# Patient Record
Sex: Male | Born: 1945 | Race: White | Hispanic: No | Marital: Married | Smoking: Former smoker
Health system: Southern US, Community
[De-identification: ages and names within clinical notes are randomized; demographics above are authoritative.]

## PROBLEM LIST (undated history)

## (undated) DIAGNOSIS — F419 Anxiety disorder, unspecified: Secondary | ICD-10-CM

## (undated) DIAGNOSIS — E119 Type 2 diabetes mellitus without complications: Secondary | ICD-10-CM

## (undated) DIAGNOSIS — C61 Malignant neoplasm of prostate: Secondary | ICD-10-CM

## (undated) HISTORY — PX: EYE SURGERY: SHX253

## (undated) HISTORY — PX: HERNIA REPAIR: SHX51

---

## 2021-01-21 ENCOUNTER — Emergency Department (HOSPITAL_BASED_OUTPATIENT_CLINIC_OR_DEPARTMENT_OTHER)
Admission: EM | Admit: 2021-01-21 | Discharge: 2021-01-21 | Disposition: A | Discharge status: Home / Self Care | Payer: Medicare PPO | Attending: Emergency Medicine | Admitting: Emergency Medicine

## 2021-01-21 ENCOUNTER — Other Ambulatory Visit: Payer: Self-pay

## 2021-01-21 ENCOUNTER — Encounter (HOSPITAL_BASED_OUTPATIENT_CLINIC_OR_DEPARTMENT_OTHER): Payer: Self-pay | Admitting: *Deleted

## 2021-01-21 ENCOUNTER — Emergency Department (HOSPITAL_BASED_OUTPATIENT_CLINIC_OR_DEPARTMENT_OTHER): Payer: Medicare PPO

## 2021-01-21 DIAGNOSIS — Z87891 Personal history of nicotine dependence: Secondary | ICD-10-CM | POA: Diagnosis not present

## 2021-01-21 DIAGNOSIS — M5441 Lumbago with sciatica, right side: Secondary | ICD-10-CM

## 2021-01-21 DIAGNOSIS — M4310 Spondylolisthesis, site unspecified: Secondary | ICD-10-CM | POA: Insufficient documentation

## 2021-01-21 DIAGNOSIS — M8938 Hypertrophy of bone, other site: Secondary | ICD-10-CM | POA: Diagnosis not present

## 2021-01-21 DIAGNOSIS — Z8546 Personal history of malignant neoplasm of prostate: Secondary | ICD-10-CM | POA: Insufficient documentation

## 2021-01-21 DIAGNOSIS — E119 Type 2 diabetes mellitus without complications: Secondary | ICD-10-CM | POA: Diagnosis not present

## 2021-01-21 DIAGNOSIS — M549 Dorsalgia, unspecified: Secondary | ICD-10-CM | POA: Diagnosis present

## 2021-01-21 DIAGNOSIS — Z794 Long term (current) use of insulin: Secondary | ICD-10-CM | POA: Insufficient documentation

## 2021-01-21 HISTORY — DX: Anxiety disorder, unspecified: F41.9

## 2021-01-21 HISTORY — DX: Malignant neoplasm of prostate: C61

## 2021-01-21 HISTORY — DX: Type 2 diabetes mellitus without complications: E11.9

## 2021-01-21 MED ORDER — LORAZEPAM 1 MG PO TABS
1.0000 mg | ORAL_TABLET | Freq: Once | ORAL | Status: AC
  Administered 2021-01-21: 1 mg via ORAL
  Filled 2021-01-21: qty 1

## 2021-01-21 MED ORDER — TRAMADOL HCL 50 MG PO TABS
50.0000 mg | ORAL_TABLET | Freq: Once | ORAL | Status: AC
  Administered 2021-01-21: 50 mg via ORAL
  Filled 2021-01-21: qty 1

## 2021-01-21 MED ORDER — LIDOCAINE 5 % EX PTCH: 1.0000 | MEDICATED_PATCH | CUTANEOUS | 0 refills | Status: AC

## 2021-01-21 NOTE — ED Triage Notes (Signed)
C/o lower right back pain which radiates down into right hip x 3 days

## 2021-01-21 NOTE — Discharge Instructions (Signed)
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  You likely have a pinched nerve in the back causing your pain  You can try lidocaine patch for pain relief.  Take Tylenol Motrin for pain as well.  Follow-up with a spine doctor  Return to ER if you have worse back pain, hip pain, numbness or weakness

## 2021-01-21 NOTE — ED Provider Notes (Addendum)
Stephen Benitez   CSN: [id number] Arrival date & time: 01/21/21  2124     History Chief Complaint  Patient presents with  . Back Pain    Stephen Benitez is a 75 y.o. male history anxiety, diabetes, prostate cancer here presenting with back pain.  Patient has been having right-sided back pain for the last 3 days.  Patient states that it is worse with movement.  It radiates to the right hip but does not go down his leg.  Denies any trauma or injury.  Denies any trouble walking.  He states that he is having panic attack right now as well but denies any chest pain or shortness of breath  The history is provided by the patient.       Past Medical History:  Diagnosis Date  . Anxiety   . Diabetes mellitus without complication ([place])   . Prostate CA ([place])     There are no problems to display for this patient.   Past Surgical History:  Procedure Laterality Date  . EYE SURGERY    . HERNIA REPAIR         No family history on file.  Social History   Tobacco Use  . Smoking status: Former Smoker  Substance Use Topics  . Alcohol use: Not Currently  . Drug use: Not Currently    Home Medications Prior to Admission medications   Medication Sig Start Date End Date Taking? Authorizing Provider  ACCU-CHEK AVIVA PLUS test strip  01/14/21   [provider]  Accu-Chek Softclix Lancets lancets  01/14/21   [provider]  lisinopril (ZESTRIL) 2.5 MG tablet Take 2.5 mg by mouth daily. 01/14/21   [provider]  metFORMIN (GLUCOPHAGE) 1000 MG tablet Take 1 tablet by mouth 2 (two) times daily. 01/14/21   [provider]  simvastatin (ZOCOR) 10 MG tablet Take by mouth. 01/14/21   [provider]  tamsulosin (FLOMAX) 0.4 MG CAPS capsule Take 0.4 mg by mouth daily. 11/13/20   [provider]  traZODone (DESYREL) 50 MG tablet Take 50 mg by mouth at bedtime. 01/09/21   [provider]    Allergies     Patient has no known allergies.  Review of Systems   Review of Systems  Musculoskeletal: Positive for back pain.  All other systems reviewed and are negative.   Physical Exam Updated Vital Signs BP (!) 162/81 (BP Location: Left Arm)   Pulse 70   Temp 97.7 F (36.5 C) (Oral)   Resp 20   Ht 6\' 4"  (1.93 m)   Wt 90.7 kg   SpO2 100%   BMI 24.34 kg/m   Physical Exam Vitals and nursing Benitez reviewed.  Constitutional:      Comments: Slightly anxious  HENT:     Head: Normocephalic.     Nose: Nose normal.     Mouth/Throat:     Mouth: Mucous membranes are moist.  Eyes:     Pupils: Pupils are equal, round, and reactive to light.  Cardiovascular:     Rate and Rhythm: Normal rate.     Pulses: Normal pulses.  Pulmonary:     Effort: Pulmonary effort is normal.     Breath sounds: Normal breath sounds.  Abdominal:     General: Abdomen is flat.     Palpations: Abdomen is soft.  Musculoskeletal:     Cervical back: Normal range of motion.     Comments: Right paralumbar tenderness.  No midline tenderness.  No saddle anesthesia.  Patient has normal straight leg raise.  Patient has normal gait  Skin:    Capillary Refill: Capillary refill takes less than 2 seconds.  Neurological:     General: No focal deficit present.     Mental Status: He is alert and oriented to person, place, and time.     Comments: No saddle anesthesia and neurovascular intact in lower extremities  Psychiatric:        Mood and Affect: Mood normal.        Behavior: Behavior normal.     ED Results / Procedures / Treatments   Labs (all labs ordered are listed, but only abnormal results are displayed) Labs Reviewed - No data to display  EKG None  Radiology DG Lumbar Spine Complete  Result Date: 01/21/2021 CLINICAL DATA:  Lower right back pain radiating into right hip for 3 days EXAM: LUMBAR SPINE - COMPLETE 4+ VIEW COMPARISON:  None. FINDINGS: Frontal, bilateral oblique, and lateral views of the lumbar  spine are obtained. There are 5 non-rib-bearing lumbar type vertebral bodies in anatomic alignment. No acute displaced fractures. 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 There is moderate facet hypertrophy greatest from L3 through S1. Sacroiliac joints are unremarkable. Bowel gas pattern is normal. IMPRESSION: 1. 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 2. Moderate lower lumbar facet hypertrophy. 3. No acute bony abnormality. Electronically Signed   By: Randa Ngo M.D.   On: 01/21/2021 22:20    Procedures Procedures   Medications Ordered in ED Medications  LORazepam (ATIVAN) tablet 1 mg (1 mg Oral Given 01/21/21 2225)  traMADol (ULTRAM) tablet 50 mg (50 mg Oral Given 01/21/21 2225)    ED Course  I have reviewed the triage vital signs and the nursing notes.  Pertinent labs & imaging results that were available during my care of the patient were reviewed by me and considered in my medical decision making (see chart for details).    MDM Rules/Calculators/A&P                         Stephen Benitez is a 75 y.o. male here presenting with back pain.  Likely muscle spasms versus mild sciatica.  Will get x-rays.  No need for MRI right now  10:44 PM X-ray showed mild spondylolisthesis and some facet hypertrophy.  Think likely mild sciatica.  I discussed doing steroids and Flexeril but patient states that he gets very anxious so does not want any steroids.  He also states that he had Flexeril in the past that did not help.  Patient would like to try lidocaine patch instead.  Will refer to spine doctor for follow-up   Final Clinical Impression(s) / ED Diagnoses Final diagnoses:  None    Rx / DC Orders ED Discharge Orders    None       Drenda Freeze, MD 01/21/21 2245    Drenda Freeze, MD 01/21/21 [phone]

## 2022-10-27 IMAGING — DX DG LUMBAR SPINE COMPLETE 4+V
5 series · 5 of 5 positions shown · non-contrast
Comparison: None.

CLINICAL DATA: Lower right back pain radiating into right hip for 3
days

EXAM:
LUMBAR SPINE - COMPLETE 4+ VIEW

[l-spine ap]
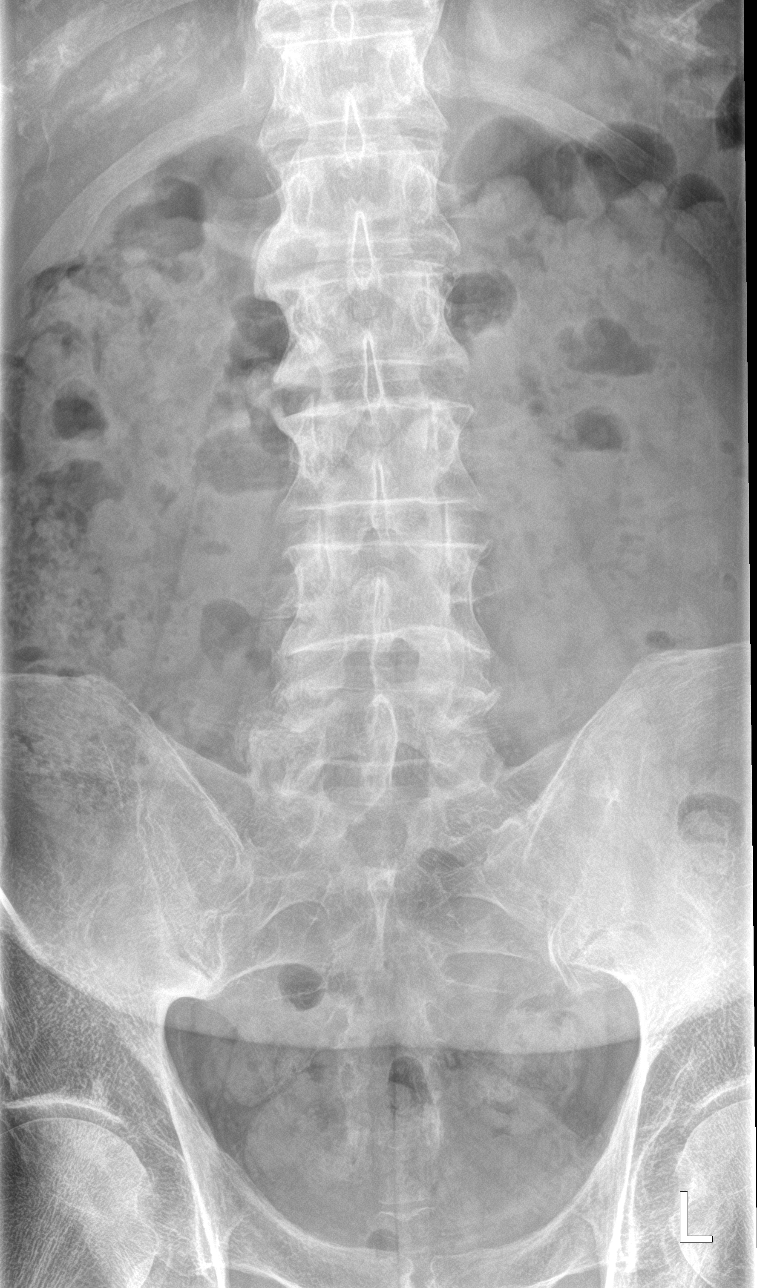

[l-spine obl (1 of 2)]
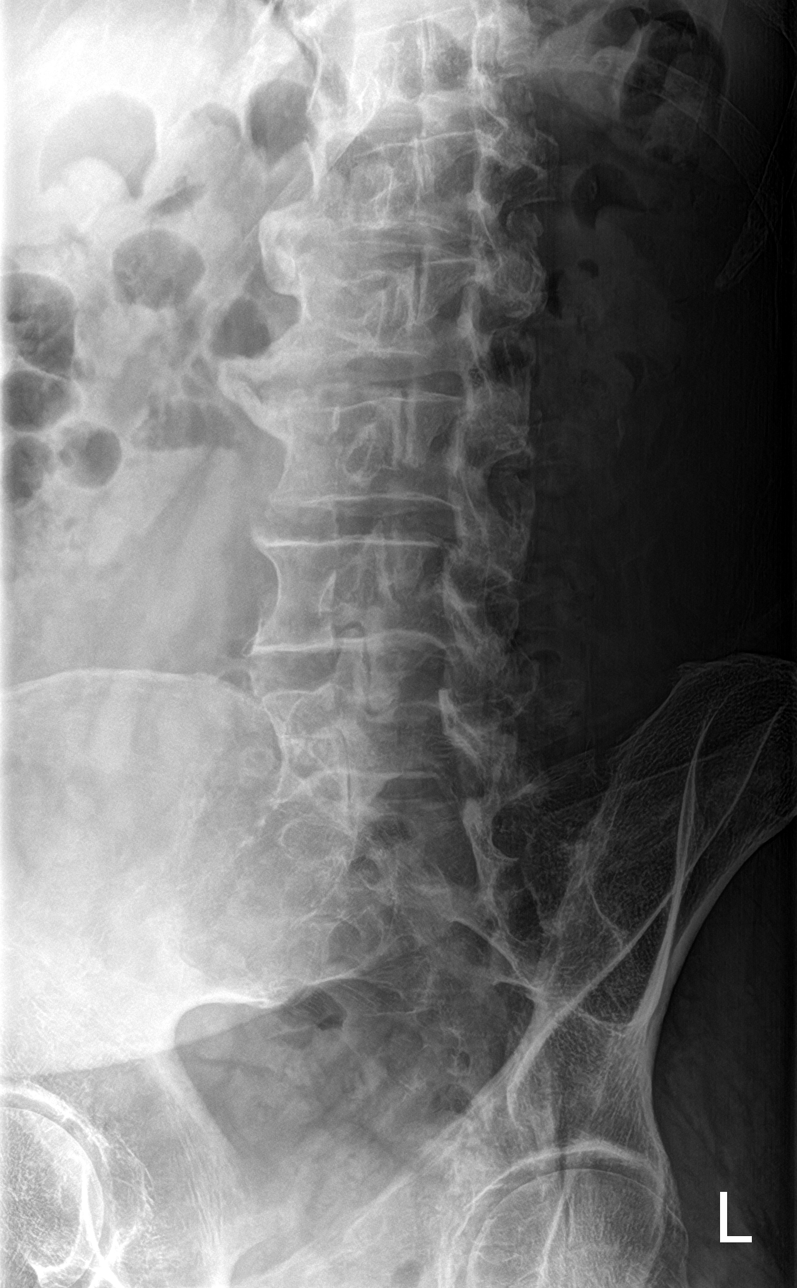

[l-spine obl (2 of 2)]
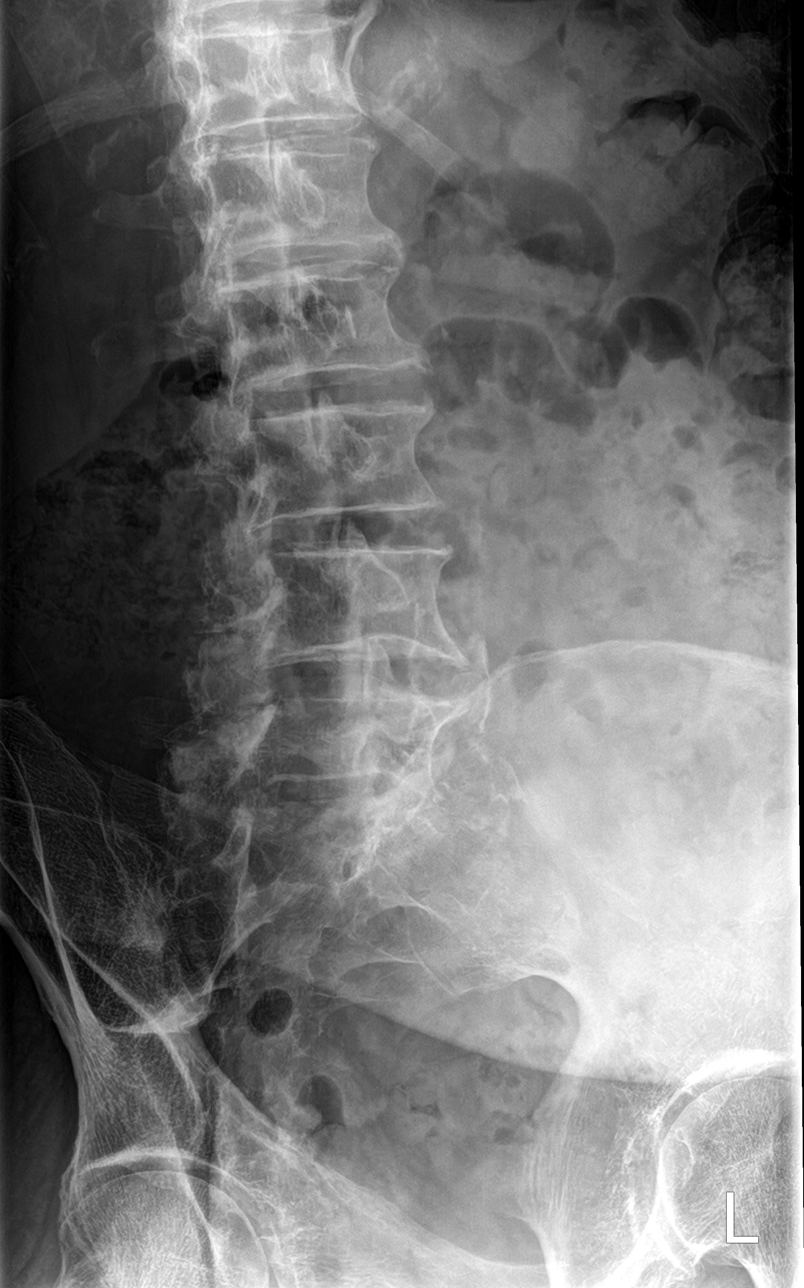

[l-spine lat]
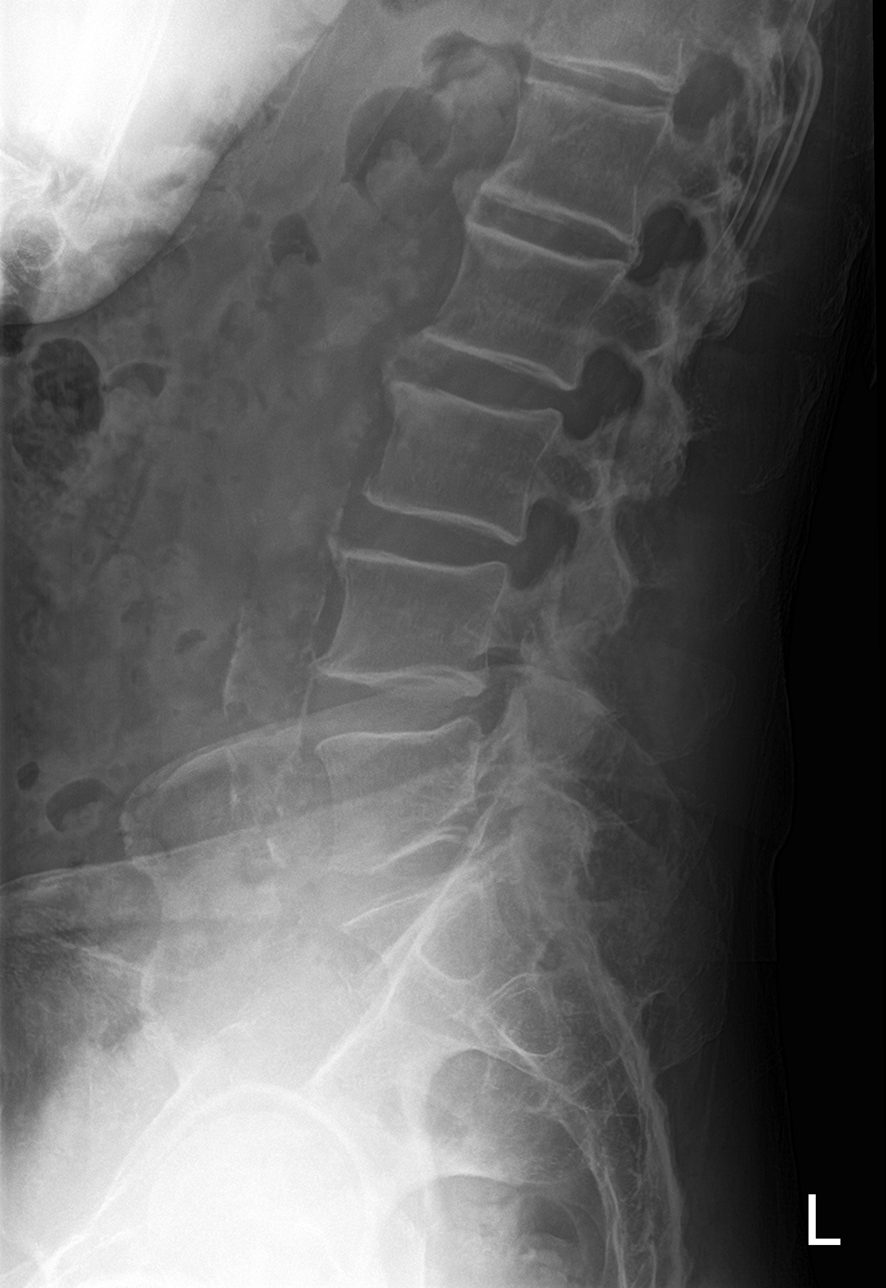

[l-spine spot]
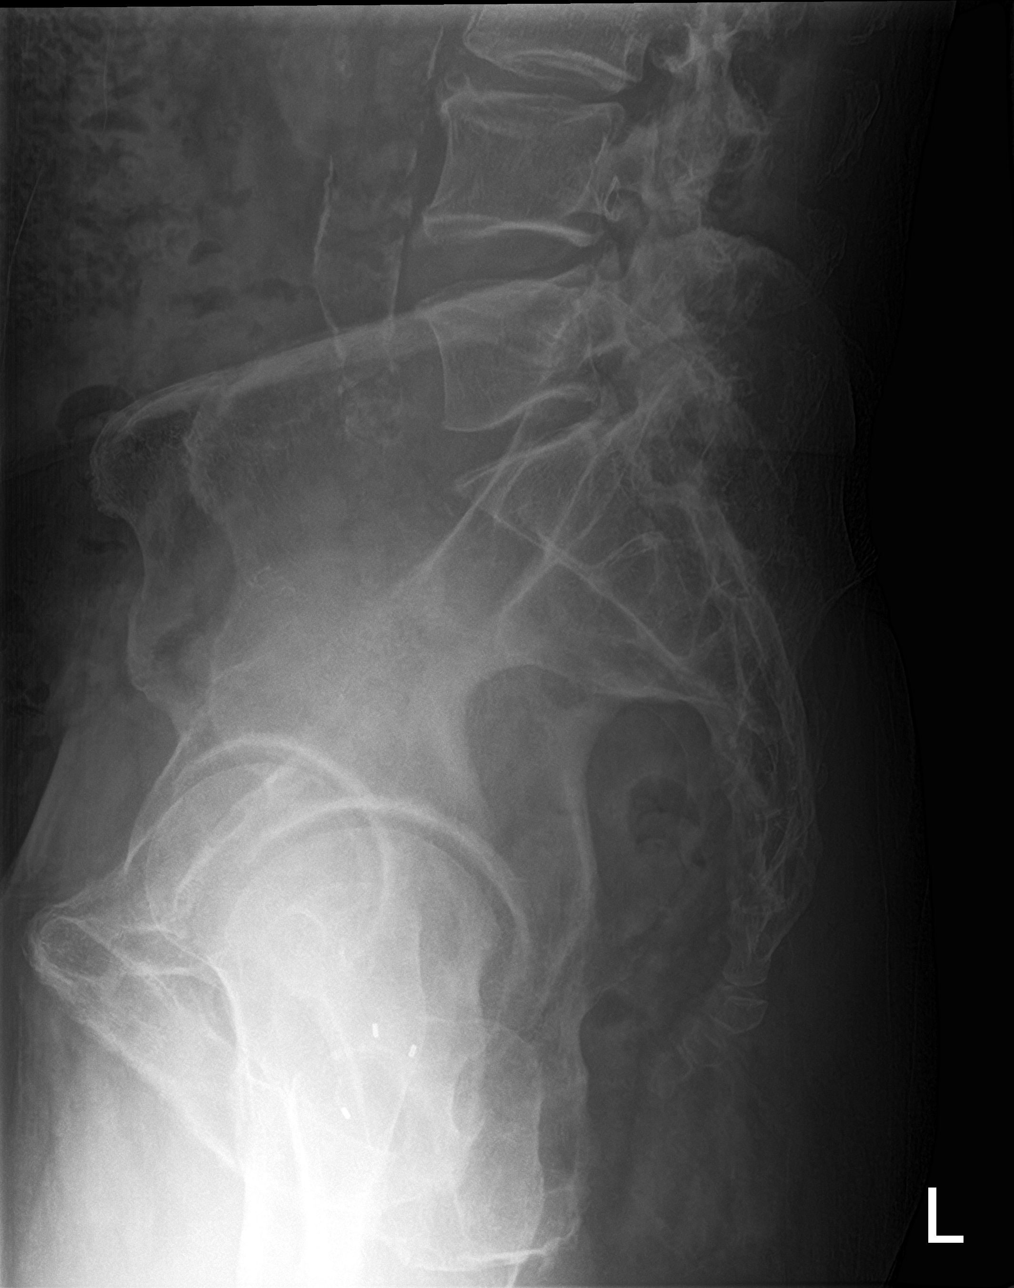

[5 of 5 positions shown; findings below may reference images not displayed]

FINDINGS: Frontal, bilateral oblique, and lateral views of the lumbar spine
are obtained. There are 5 non-rib-bearing lumbar type vertebral
bodies in anatomic alignment. No acute displaced fractures. Mild
spondylosis at the thoracolumbar junction. There is moderate facet
hypertrophy greatest from L3 through S1. Sacroiliac joints are
unremarkable. Bowel gas pattern is normal.
IMPRESSION: 1. Mild spondylosis at the thoracolumbar junction.
2. Moderate lower lumbar facet hypertrophy.
3. No acute bony abnormality.
